# Patient Record
Sex: Male | Born: 2000 | Race: White | Hispanic: No | Marital: Single | Smoking: Never smoker
Health system: Southern US, Community
[De-identification: ages and names within clinical notes are randomized; demographics above are authoritative.]

---
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
## 2021-03-19 IMAGING — DX DG CHEST 2V
2 series · 2 of 2 positions shown · non-contrast
Comparison: 09/07/2017

CLINICAL DATA: Shortness of breath

EXAM:
CHEST - 2 VIEW

[chest pa]
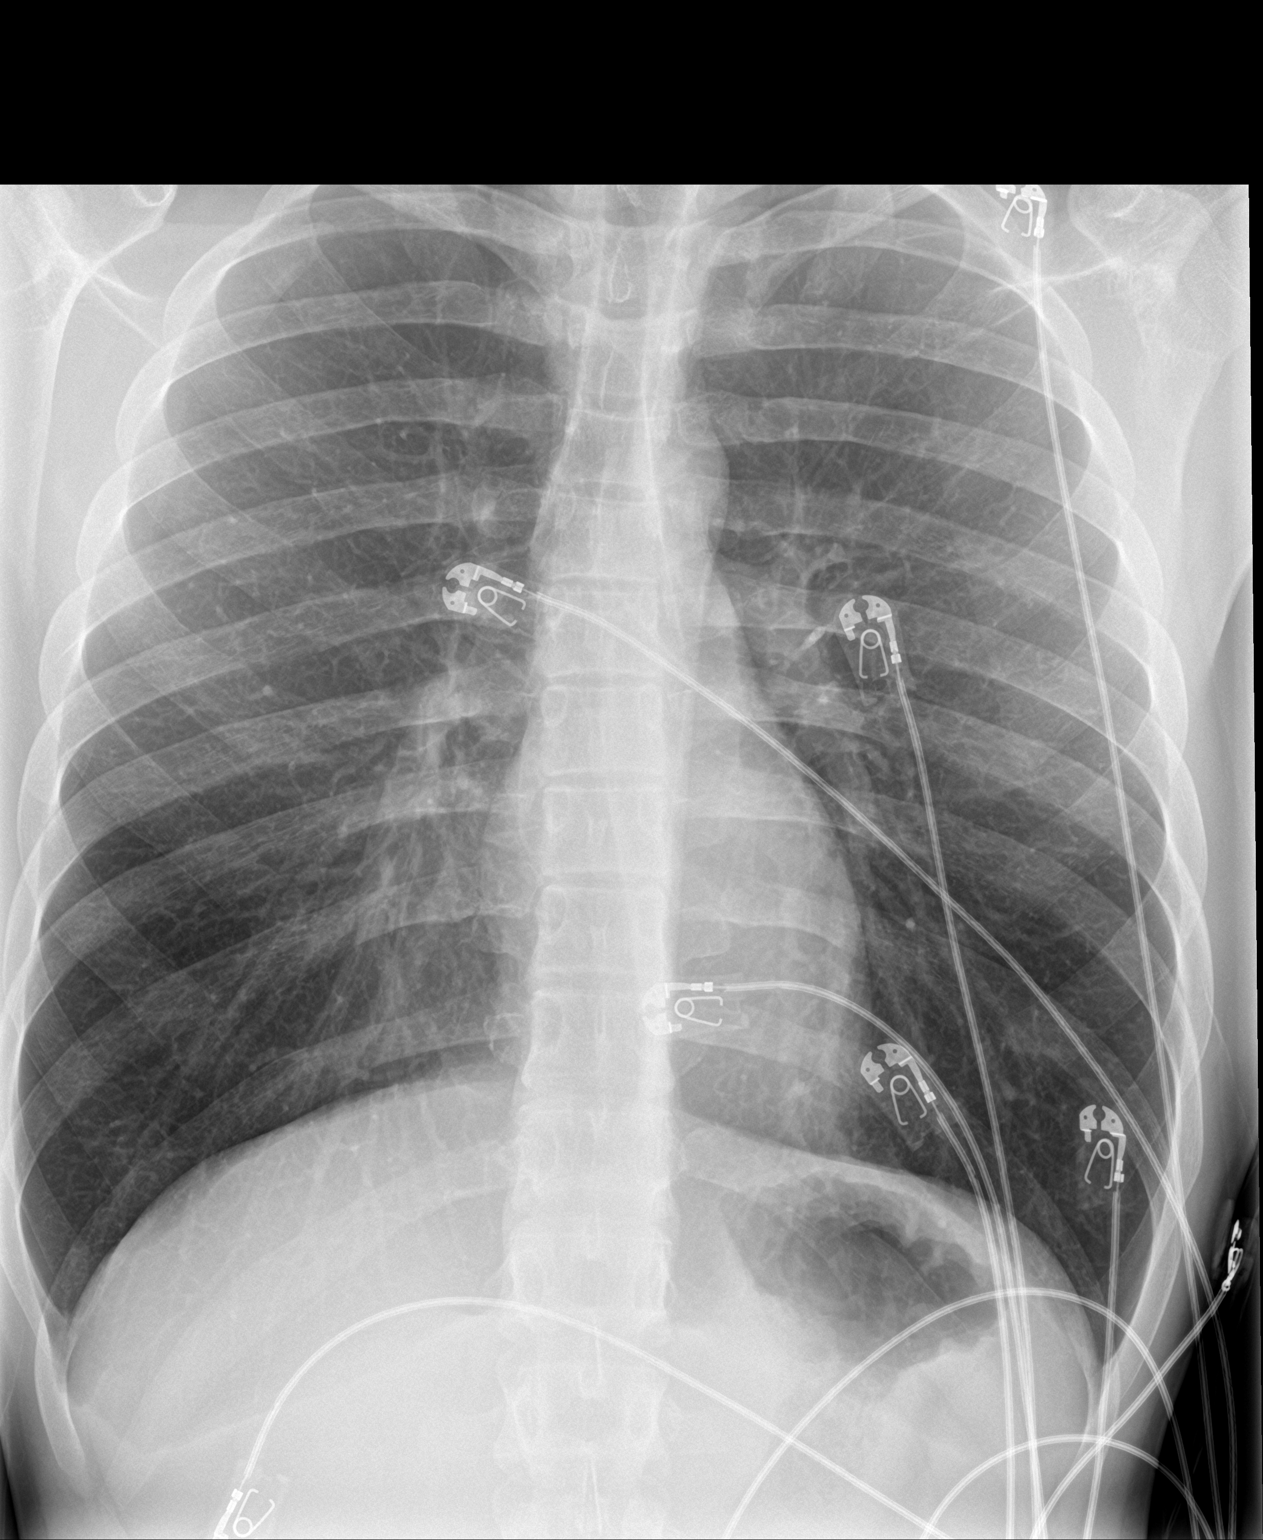

[chest lat]
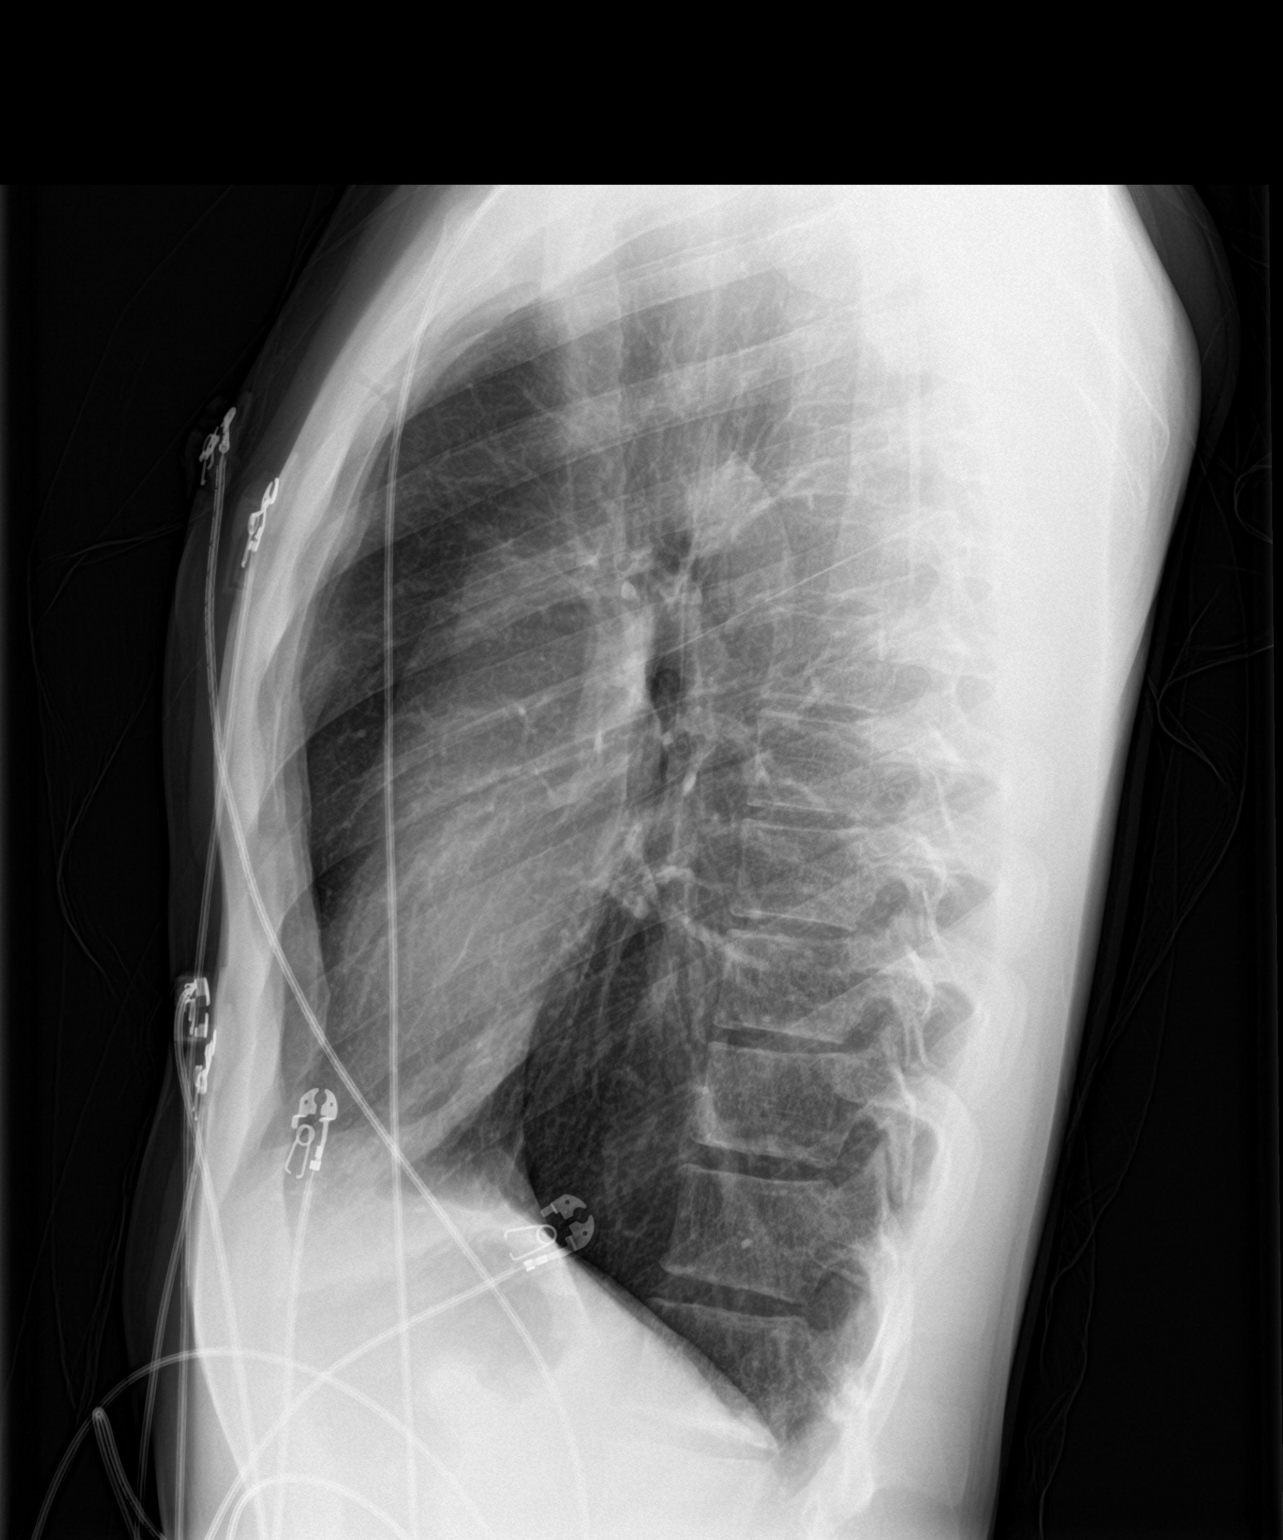

[2 of 2 positions shown; findings below may reference images not displayed]

FINDINGS: Heart size is normal. Mediastinal shadows are normal. The lungs are
clear. No bronchial thickening. No infiltrate, mass, effusion or
collapse. Pulmonary vascularity is normal. No bony abnormality.
IMPRESSION: Normal chest

## 2021-04-06 ENCOUNTER — Emergency Department (HOSPITAL_BASED_OUTPATIENT_CLINIC_OR_DEPARTMENT_OTHER)
Admission: EM | Admit: 2021-04-06 | Discharge: 2021-04-06 | Disposition: A | Discharge status: Home / Self Care | Payer: 59 | Attending: Emergency Medicine | Admitting: Emergency Medicine

## 2021-04-06 ENCOUNTER — Emergency Department (HOSPITAL_BASED_OUTPATIENT_CLINIC_OR_DEPARTMENT_OTHER): Payer: 59

## 2021-04-06 ENCOUNTER — Encounter (HOSPITAL_BASED_OUTPATIENT_CLINIC_OR_DEPARTMENT_OTHER): Payer: Self-pay | Admitting: *Deleted

## 2021-04-06 ENCOUNTER — Other Ambulatory Visit: Payer: Self-pay

## 2021-04-06 DIAGNOSIS — J069 Acute upper respiratory infection, unspecified: Secondary | ICD-10-CM | POA: Diagnosis not present

## 2021-04-06 DIAGNOSIS — U071 COVID-19: Secondary | ICD-10-CM | POA: Diagnosis not present

## 2021-04-06 DIAGNOSIS — R059 Cough, unspecified: Secondary | ICD-10-CM | POA: Diagnosis present

## 2021-04-06 LAB — RESP PANEL BY RT-PCR (FLU A&B, COVID) ARPGX2
Influenza A by PCR: NEGATIVE
Influenza B by PCR: NEGATIVE
SARS Coronavirus 2 by RT PCR: POSITIVE — AB

## 2021-04-06 NOTE — ED Provider Notes (Addendum)
[facility] EMERGENCY DEPT Provider Note   CSN: [id number] Arrival date & time: 04/06/21  1700     History Chief Complaint  Patient presents with  . Fatigue  . Chest discomfort    Harold James is a 20 y.o. male.  The history is provided by the patient.  URI Presenting symptoms: cough and fatigue   Presenting symptoms: no ear pain, no fever and no sore throat   Severity:  Mild Onset quality:  Gradual Timing:  Intermittent Progression:  Waxing and waning Chronicity:  New Relieved by:  Nothing Associated symptoms: no arthralgias and no headaches   Risk factors: sick contacts (close contact with covid)        History reviewed. No pertinent past medical history.  There are no problems to display for this patient.   History reviewed. No pertinent surgical history.     History reviewed. No pertinent family history.  Social History   Tobacco Use  . Smoking status: Never Smoker  . Smokeless tobacco: Never Used  Substance Use Topics  . Alcohol use: Yes    Comment: occasionally    Home Medications Prior to Admission medications   Medication Sig Start Date End Date Taking? Authorizing Provider  ibuprofen (ADVIL,MOTRIN) 600 MG tablet Take 1 tablet (600 mg total) every 6 (six) hours as needed by mouth for mild pain or moderate pain. 09/06/17  Yes [place], Kennis Carina, NP    Allergies    Penicillins  Review of Systems   Review of Systems  Constitutional: Positive for fatigue. Negative for chills and fever.  HENT: Negative for ear pain and sore throat.   Eyes: Negative for pain and visual disturbance.  Respiratory: Positive for cough. Negative for shortness of breath.   Cardiovascular: Positive for chest pain. Negative for palpitations.  Gastrointestinal: Negative for abdominal pain and vomiting.  Genitourinary: Negative for dysuria and hematuria.  Musculoskeletal: Negative for arthralgias and back pain.  Skin: Negative for color change and rash.   Neurological: Negative for seizures, syncope and headaches.  All other systems reviewed and are negative.   Physical Exam Updated Vital Signs BP 139/87 (BP Location: Left Arm)   Pulse 88   Temp 98.7 F (37.1 C) (Oral)   Resp 15   Ht 5\' 10"  (1.778 m)   Wt 74.8 kg   SpO2 96%   BMI 23.68 kg/m   Physical Exam Vitals and nursing note reviewed.  Constitutional:      General: He is not in acute distress.    Appearance: He is well-developed. He is not ill-appearing.  HENT:     Head: Normocephalic and atraumatic.  Eyes:     Conjunctiva/sclera: Conjunctivae normal.  Cardiovascular:     Rate and Rhythm: Normal rate and regular rhythm.     Pulses: Normal pulses.     Heart sounds: Normal heart sounds. No murmur heard.   Pulmonary:     Effort: Pulmonary effort is normal. No respiratory distress.     Breath sounds: Normal breath sounds.  Abdominal:     Palpations: Abdomen is soft.     Tenderness: There is no abdominal tenderness.  Musculoskeletal:     Cervical back: Normal range of motion and neck supple.  Skin:    General: Skin is warm and dry.     Capillary Refill: Capillary refill takes less than 2 seconds.  Neurological:     General: No focal deficit present.     Mental Status: He is alert.     ED Results /  Procedures / Treatments   Labs (all labs ordered are listed, but only abnormal results are displayed) Labs Reviewed  RESP PANEL BY RT-PCR (FLU A&B, COVID) [id number]    EKG EKG Interpretation  Date/Time:  Wednesday April 06 2021 17:11:29 EDT Ventricular Rate:  87 PR Interval:  125 QRS Duration: 83 QT Interval:  321 QTC Calculation: 387 R Axis:   85 Text Interpretation: Sinus rhythm Confirmed by Lennice Sites ([id number]) on 04/06/2021 5:17:52 PM   Radiology DG Chest Portable 1 View  Result Date: 04/06/2021 CLINICAL DATA:  Cough, chest discomfort EXAM: PORTABLE CHEST 1 VIEW COMPARISON:  11/06/2019 FINDINGS: The heart size and mediastinal contours are within normal  limits. Both lungs are clear. The visualized skeletal structures are unremarkable. IMPRESSION: Normal study. Electronically Signed   By: Rolm Baptise M.D.   On: 04/06/2021 18:01    Procedures Procedures   Medications Ordered in ED Medications - No data to display  ED Course  I have reviewed the triage vital signs and the nursing notes.  Pertinent labs & imaging results that were available during my care of the patient were reviewed by me and considered in my medical decision making (see chart for details).    MDM Rules/Calculators/A&P                          Harold James is here with URI symptoms.  Has had a cough and some fatigue and some chest pain.  Normal vitals.  EKG shows sinus rhythm.  No ischemic changes.  Overall appears well.  No signs of respiratory distress.  Had close contact with someone with COVID.  We will do COVID swab.  Chest x-ray showed no evidence of pneumothorax, pneumonia.  Recommend Tylenol as needed for body aches.  No concern for ACS or PE and overall suspect viral process.  Discharged in good condition.   Prior to discharge patient tested positive for COVID.  Told to quarantine.  Follow CDC guidelines.  This chart was dictated using voice recognition software.  Despite best efforts to proofread,  errors can occur which can change the documentation meaning.    Final Clinical Impression(s) / ED Diagnoses Final diagnoses:  Upper respiratory tract infection, unspecified type    Rx / DC Orders ED Discharge Orders    None       Lennice Sites, DO 04/06/21 [place], [place], DO 04/06/21 1843

## 2021-04-06 NOTE — ED Triage Notes (Signed)
Fatigue, dry cough and chest discomfort started 2 days ago.

## 2021-04-06 NOTE — ED Notes (Signed)
Possible covid exposure from friend , approx 1 week ago

## 2021-04-06 NOTE — ED Notes (Signed)
Pt informed by RN/MD of Covid Results, AVS reviewed with client and copy of AVS provided. CDC guidelines, recommendations discussed in re: Covid Positive results. Opportunity for questions provided

## 2021-04-06 NOTE — Discharge Instructions (Signed)
Please quarantine until hear back about your COVID test.  You should be able to follow-up your COVID test later this evening on your MyChart.  Overall suspect that you likely have a viral process even if your COVID/flu test is negative.  Suspect that you will get better on your own and can take Tylenol as needed for any discomfort.  Chest x-ray showed no evidence of pneumonia or other emergent process. However, return if symptoms worsen.

## 2021-04-06 NOTE — ED Notes (Signed)
New onset of fever last night, has mid sternal aching chest pain, non -radiating, has had some prod cough of thick greenish in color.

## 2022-08-23 ENCOUNTER — Other Ambulatory Visit: Payer: Self-pay

## 2022-08-23 ENCOUNTER — Emergency Department (HOSPITAL_BASED_OUTPATIENT_CLINIC_OR_DEPARTMENT_OTHER): Payer: Commercial Managed Care - HMO | Admitting: Radiology

## 2022-08-23 ENCOUNTER — Encounter (HOSPITAL_BASED_OUTPATIENT_CLINIC_OR_DEPARTMENT_OTHER): Payer: Self-pay

## 2022-08-23 ENCOUNTER — Emergency Department (HOSPITAL_BASED_OUTPATIENT_CLINIC_OR_DEPARTMENT_OTHER)
Admission: EM | Admit: 2022-08-23 | Discharge: 2022-08-23 | Disposition: A | Discharge status: Home / Self Care | Payer: Commercial Managed Care - HMO | Attending: Emergency Medicine | Admitting: Emergency Medicine

## 2022-08-23 DIAGNOSIS — D45 Polycythemia vera: Secondary | ICD-10-CM | POA: Diagnosis not present

## 2022-08-23 DIAGNOSIS — D751 Secondary polycythemia: Secondary | ICD-10-CM

## 2022-08-23 DIAGNOSIS — R000 Tachycardia, unspecified: Secondary | ICD-10-CM | POA: Diagnosis not present

## 2022-08-23 DIAGNOSIS — R002 Palpitations: Secondary | ICD-10-CM

## 2022-08-23 LAB — BASIC METABOLIC PANEL
Anion gap: 14 (ref 5–15)
BUN: 15 mg/dL (ref 6–20)
CO2: 24 mmol/L (ref 22–32)
Calcium: 10.4 mg/dL — ABNORMAL HIGH (ref 8.9–10.3)
Chloride: 99 mmol/L (ref 98–111)
Creatinine, Ser: 1.11 mg/dL (ref 0.61–1.24)
GFR, Estimated: >60 mL/min (ref >60)
Glucose, Bld: 88 mg/dL (ref 70–99)
Potassium: 3.8 mmol/L (ref 3.5–5.1)
Sodium: 137 mmol/L (ref 135–145)

## 2022-08-23 LAB — CBC
HCT: 52.9 % — ABNORMAL HIGH (ref 39.0–52.0)
Hemoglobin: 18.2 g/dL — ABNORMAL HIGH (ref 13.0–17.0)
MCH: 29.8 pg (ref 26.0–34.0)
MCHC: 34.4 g/dL (ref 30.0–36.0)
MCV: 86.6 fL (ref 80.0–100.0)
Platelets: 261 10*3/uL (ref 150–400)
RBC: 6.11 MIL/uL — ABNORMAL HIGH (ref 4.22–5.81)
RDW: 11.9 % (ref 11.5–15.5)
WBC: 8.9 10*3/uL (ref 4.0–10.5)
nRBC: 0 % (ref 0.0–0.2)

## 2022-08-23 LAB — TROPONIN I (HIGH SENSITIVITY): Troponin I (High Sensitivity): <2 ng/L (ref <18)

## 2022-08-23 NOTE — ED Provider Notes (Signed)
[facility] EMERGENCY DEPT Provider Note   CSN: [id number] Arrival date & time: 08/23/22  0272     History  Chief Complaint  Patient presents with   Palpitations    Harold James is a 21 y.o. male.  The history is provided by the patient.  Palpitations He woke up this afternoon with sense that his heart was racing.  He denies chest pain, heaviness, tightness, pressure.  He denies dizziness or lightheadedness.  The sense that his heart was racing was intermittent throughout the afternoon.  He currently feels fine.  He has had similar problems in the past, but not as severe.  It has never been evaluated.  He did not check his heart rate.  He denies caffeine use but does use nicotine pouches.  He denies illicit drug use.   Home Medications Prior to Admission medications   Medication Sig Start Date End Date Taking? Authorizing Provider  ibuprofen (ADVIL,MOTRIN) 600 MG tablet Take 1 tablet (600 mg total) every 6 (six) hours as needed by mouth for mild pain or moderate pain. 09/06/17   Jean Rosenthal, NP      Allergies    Penicillins    Review of Systems   Review of Systems  Cardiovascular:  Positive for palpitations.  All other systems reviewed and are negative.   Physical Exam Updated Vital Signs BP 131/86   Pulse 77   Temp 98.8 F (37.1 C)   Resp 20   Ht '5\' 9"'$  (1.753 m)   Wt 81.6 kg   SpO2 99%   BMI 26.58 kg/m  Physical Exam Vitals and nursing note reviewed.   21 year old male, resting comfortably and in no acute distress. Vital signs are normal. Oxygen saturation is 99%, which is normal. Head is normocephalic and atraumatic. PERRLA, EOMI. Oropharynx is clear. Neck is nontender and supple without adenopathy or JVD. Back is nontender and there is no CVA tenderness. Lungs are clear without rales, wheezes, or rhonchi. Chest is nontender. Heart has regular rate and rhythm without murmur. Abdomen is soft, flat, nontender without masses or  hepatosplenomegaly and peristalsis is normoactive. Extremities have no cyanosis or edema, full range of motion is present. Skin is warm and dry without rash. Neurologic: Mental status is normal, cranial nerves are intact, there are no motor or sensory deficits.  ED Results / Procedures / Treatments   Labs (all labs ordered are listed, but only abnormal results are displayed) Labs Reviewed  BASIC METABOLIC PANEL - Abnormal; Notable for the following components:      Result Value   Calcium 10.4 (*)    All other components within normal limits  CBC - Abnormal; Notable for the following components:   RBC 6.11 (*)    Hemoglobin 18.2 (*)    HCT 52.9 (*)    All other components within normal limits  TROPONIN I (HIGH SENSITIVITY)    EKG EKG Interpretation  Date/Time:  Wednesday August 23 2022 18:46:16 EDT Ventricular Rate:  111 PR Interval:  122 QRS Duration: 82 QT Interval:  310 QTC Calculation: 421 R Axis:   95 Text Interpretation: Sinus tachycardia Rightward axis Borderline ECG When compared with ECG of 06-Apr-2021 17:11, HEART RATE has increased Confirmed by Delora Fuel ([id number]) on 08/23/2022 10:56:56 PM  Radiology DG Chest 2 View  Result Date: 08/23/2022 CLINICAL DATA:  Anxiety and feels like his heart is "pounding". EXAM: CHEST - 2 VIEW COMPARISON:  April 06, 2021 FINDINGS: The heart size and mediastinal contours are within  normal limits. Both lungs are clear. The visualized skeletal structures are unremarkable. IMPRESSION: No active cardiopulmonary disease. Electronically Signed   By: Virgina Norfolk M.D.   On: 08/23/2022 19:15    Procedures Procedures  Cardiac monitor shows normal sinus rhythm, per my interpretation.  Medications Ordered in ED Medications - No data to display  ED Course/ Medical Decision Making/ A&P                           Medical Decision Making Amount and/or Complexity of Data Reviewed Labs: ordered. Radiology: ordered.   Palpitations.  I  have reviewed and interpreted his electrocardiogram, my interpretation is sinus tachycardia.  Heart monitor is currently showing normal heart rate.  I suspect that his tachycardia is solely sinus tachycardia.  I reviewed and interpreted his laboratory tests and my interpretation is polycythemia which is chronic.  Chest x-ray shows no acute cardiopulmonary disease.  I have independently viewed the images, and agree with the radiologist's interpretation.  He was noted to have elevated blood pressure initially and this is felt to be stress related.  Follow-up blood pressures in the ED have been normal.  I am referring him to cardiology for consideration for outpatient cardiac monitoring.  He is encouraged to stop using all forms of nicotine.  Final Clinical Impression(s) / ED Diagnoses Final diagnoses:  Palpitations  Polycythemia    Rx / DC Orders ED Discharge Orders     None         Delora Fuel, MD 62/22/97 2354

## 2022-08-23 NOTE — ED Triage Notes (Signed)
Pt states that he has felt his heart "pounding" since this morning. Pt also endorses some anxiety. No CP, no SHOB.

## 2023-01-09 NOTE — Progress Notes (Shared)
Harold James is a 22 y.o. male here for a new patient visit.  History of Present Illness:   No chief complaint on file.   HPI  No past medical history on file.   Social History   Tobacco Use   Smoking status: Never   Smokeless tobacco: Never  Substance Use Topics   Alcohol use: Yes    Alcohol/week: 4.0 standard drinks of alcohol    Types: 2 Cans of beer, 2 Shots of liquor per week    Comment: on the weekends   Drug use: Never    No past surgical history on file.  No family history on file.  Allergies  Allergen Reactions   Penicillins Rash    Has patient had a PCN reaction causing immediate rash, facial/tongue/throat swelling, SOB or lightheadedness with hypotension: Yes Has patient had a PCN reaction causing severe rash involving mucus membranes or skin necrosis: No  Has patient had a PCN reaction that required hospitalization No Has patient had a PCN reaction occurring within the last 10 years: Yes If all of the above answers are "NO", then may proceed with Cephalosporin use.     Current Medications:   Current Outpatient Medications:    ibuprofen (ADVIL,MOTRIN) 600 MG tablet, Take 1 tablet (600 mg total) every 6 (six) hours as needed by mouth for mild pain or moderate pain., Disp: 30 tablet, Rfl: 0   Review of Systems:   ROS  Vitals:   There were no vitals filed for this visit.   There is no height or weight on file to calculate BMI.  Physical Exam:   Physical Exam  Assessment and Plan:   ***   I,Alexander Ruley,acting as a scribe for Inda Coke, PA.,have documented all relevant documentation on the behalf of Inda Coke, PA,as directed by  Inda Coke, PA while in the presence of Inda Coke, [place].   ***   Inda Coke, PA-C

## 2023-01-17 ENCOUNTER — Ambulatory Visit: Payer: Self-pay | Admitting: Physician Assistant

## 2024-09-15 ENCOUNTER — Ambulatory Visit: Payer: Self-pay

## 2024-09-15 NOTE — Telephone Encounter (Signed)
 FYI Only or Action Required?: Action required by provider: update on patient condition.  Patient was last seen in primary care on new patient.  Called Nurse Triage reporting Cough, Nasal Congestion, and Fatigue.  Symptoms began a week ago.  Interventions attempted: Nothing.  Symptoms are: unchanged.  Triage Disposition: Home Care  Patient/caregiver understands and will follow disposition?: Yes  Reason for Triage: New patient having cough and stuffy nose - mother Raeann requested appt with Dr. Wendolyn. Please call her at [phone]   Reason for Disposition  Common cold with no complications  Answer Assessment - Initial Assessment Questions 1. ONSET: When did the nasal discharge start?      About a week ago 2. AMOUNT: How much discharge is there?      Blows nose often, talks like he is stuffy 3. COUGH: Do you have a cough? If Yes, ask: Describe the color of your mucus. (e.g., clear, white, yellow, green)     Productive at times 4. RESPIRATORY DISTRESS: Describe your breathing.      Some congestion, but no difficulty breathing 5. FEVER: Do you have a fever? If Yes, ask: What is your temperature, how was it measured, and when did it start?     unsure 6. SEVERITY: Overall, how bad are you feeling right now? (e.g., doesn't interfere with normal activities, staying home from school/work, staying in bed)      Home from work 7. OTHER SYMPTOMS: Do you have any other symptoms? (e.g., earache, mouth sores, sore throat, wheezing)     Cough, fatigue  Protocols used: Common Cold-A-AH

## 2025-01-09 ENCOUNTER — Ambulatory Visit: Admitting: Family Medicine
